# Patient Record
Sex: Male | Born: 2000 | Race: White | Hispanic: No | Marital: Single | Smoking: Never smoker
Health system: Southern US, Community
[De-identification: ages and names within clinical notes are randomized; demographics above are authoritative.]

## PROBLEM LIST (undated history)

## (undated) DIAGNOSIS — R519 Headache, unspecified: Secondary | ICD-10-CM

## (undated) DIAGNOSIS — R51 Headache: Secondary | ICD-10-CM

## (undated) HISTORY — DX: Headache: R51

## (undated) HISTORY — DX: Headache, unspecified: R51.9

---
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
## 2022-01-01 ENCOUNTER — Other Ambulatory Visit: Payer: Self-pay

## 2022-01-02 ENCOUNTER — Other Ambulatory Visit: Payer: Self-pay

## 2022-01-19 ENCOUNTER — Other Ambulatory Visit: Payer: Self-pay

## 2022-01-26 ENCOUNTER — Ambulatory Visit: Payer: BC Managed Care – PPO | Admitting: Family Medicine

## 2022-02-02 NOTE — Progress Notes (Deleted)
  [name] [address] [place] [address] Phone: [phone] Subjective:    I'm seeing this patient by the request  of:  Glori Luis, MD  CC:   [id number]  Matthew Parks is a 21 y.o. male coming in with complaint of back and neck pain. OMT 12/22/2021. Patient states   Medications patient has been prescribed: Zanaflex  Taking:         Reviewed prior external information including notes and imaging from previsou exam, outside providers and external EMR if available.   As well as notes that were available from care everywhere and other healthcare systems.  Past medical history, social, surgical and family history all reviewed in electronic medical record.  No pertanent information unless stated regarding to the chief complaint.   Past Medical History:  Diagnosis Date   Headache     Allergies  Allergen Reactions   Scopolamine Other (See Comments)    Extreme anxiety and confusion     Review of Systems:  No headache, visual changes, nausea, vomiting, diarrhea, constipation, dizziness, abdominal pain, skin rash, fevers, chills, night sweats, weight loss, swollen lymph nodes, body aches, joint swelling, chest pain, shortness of breath, mood changes. POSITIVE muscle aches  Objective  There were no vitals taken for this visit.   General: No apparent distress alert and oriented x3 mood and affect normal, dressed appropriately.  HEENT: Pupils equal, extraocular movements intact  Respiratory: Patient's speak in full sentences and does not appear short of breath  Cardiovascular: No lower extremity edema, non tender, no erythema  Gait MSK:  Back   Osteopathic findings  C2 flexed rotated and side bent right C6 flexed rotated and side bent left T3 extended rotated and side bent right inhaled rib T9 extended rotated and side bent left L2 flexed rotated and side bent right Sacrum right on right       Assessment and  Plan:  No problem-specific Assessment & Plan notes found for this encounter.    Nonallopathic problems  Decision today to treat with OMT was based on Physical Exam  After verbal consent patient was treated with HVLA, ME, FPR techniques in cervical, rib, thoracic, lumbar, and sacral  areas  Patient tolerated the procedure well with improvement in symptoms  Patient given exercises, stretches and lifestyle modifications  See medications in patient instructions if given  Patient will follow up in 4-8 weeks             Note: This dictation was prepared with Dragon dictation along with smaller phrase technology. Any transcriptional errors that result from this process are unintentional.

## 2022-02-06 ENCOUNTER — Ambulatory Visit: Payer: BC Managed Care – PPO | Admitting: Family Medicine

## 2022-02-08 ENCOUNTER — Ambulatory Visit: Payer: BC Managed Care – PPO | Admitting: Family Medicine

## 2022-02-16 ENCOUNTER — Other Ambulatory Visit: Payer: Self-pay

## 2022-02-19 ENCOUNTER — Other Ambulatory Visit: Payer: Self-pay

## 2022-02-19 ENCOUNTER — Other Ambulatory Visit: Payer: Self-pay | Admitting: Family Medicine

## 2022-02-19 ENCOUNTER — Encounter: Payer: Self-pay | Admitting: Family Medicine

## 2022-02-19 DIAGNOSIS — F411 Generalized anxiety disorder: Secondary | ICD-10-CM

## 2022-02-20 ENCOUNTER — Other Ambulatory Visit: Payer: Self-pay

## 2022-02-20 MED ORDER — FLUOXETINE HCL 20 MG PO TABS
20.0000 mg | ORAL_TABLET | Freq: Every day | ORAL | 3 refills | Status: DC
  Filled 2022-02-20: qty 90, 90d supply, fill #0
  Filled 2022-07-17: qty 90, 90d supply, fill #1
  Filled 2022-09-10 – 2022-10-19 (×2): qty 90, 90d supply, fill #2
  Filled 2023-01-04: qty 90, 90d supply, fill #3

## 2022-02-21 ENCOUNTER — Telehealth: Payer: BC Managed Care – PPO | Admitting: Physician Assistant

## 2022-02-21 ENCOUNTER — Other Ambulatory Visit: Payer: Self-pay

## 2022-02-21 DIAGNOSIS — A084 Viral intestinal infection, unspecified: Secondary | ICD-10-CM

## 2022-02-21 MED ORDER — ONDANSETRON 4 MG PO TBDP
4.0000 mg | ORAL_TABLET | Freq: Three times a day (TID) | ORAL | 0 refills | Status: DC | PRN
  Filled 2022-02-21: qty 14, 5d supply, fill #0
  Filled 2022-02-21: qty 18, 21d supply, fill #0
  Filled 2022-07-13: qty 18, 21d supply, fill #1

## 2022-02-21 NOTE — Patient Instructions (Signed)
Matthew Parks, thank you for joining [name], PA-C for today's virtual visit.  While this provider is not your primary care provider (PCP), if your PCP is located in our provider database this encounter information will be shared with them immediately following your visit.   [facility] account gives you access to today's visit and all your visits, tests, and labs performed at [facility] " click here if you don't have a [facility] account or go to [email or web address]  Consent: (Patient) Matthew Parks provided verbal consent for this virtual visit at the beginning of the encounter.  Current Medications:  Current Outpatient Medications:    FLUoxetine (PROZAC) 20 MG tablet, Take 1 tablet (20 mg total) by mouth daily., Disp: 90 tablet, Rfl: 3   hydrOXYzine (ATARAX) 10 MG tablet, Take 1 tablet (10 mg total) by mouth 3 (three) times daily as needed., Disp: 90 tablet, Rfl: 0   hydrOXYzine (ATARAX) 25 MG tablet, Take 1 tablet (25 mg total) by mouth at bedtime as needed for anxiety., Disp: 30 tablet, Rfl: 1   ibuprofen (ADVIL) 600 MG tablet, Take 600 mg by mouth as needed., Disp: , Rfl:    meclizine (ANTIVERT) 25 MG tablet, Take 25 mg by mouth 3 (three) times daily as needed for dizziness or nausea., Disp: , Rfl:    promethazine (PHENERGAN) 12.5 MG tablet, Take 1 tablet (12.5 mg total) by mouth every 8 (eight) hours as needed for nausea or vomiting., Disp: 20 tablet, Rfl: 0   Rimegepant Sulfate (NURTEC) 75 MG TBDP, Take 75 mg by mouth every other day., Disp: 16 tablet, Rfl: 6   tiZANidine (ZANAFLEX) 2 MG tablet, Take 1 tablet (2 mg total) by mouth at bedtime., Disp: 30 tablet, Rfl: 0   zolmitriptan (ZOMIG-ZMT) 5 MG disintegrating tablet, Dissolve 1 tablet (5 mg total) by mouth as needed for migraine. May repeat a dose in 2 hours if headache persists. Max dose 2 tablets in 24 hours, Disp: 10 tablet, Rfl: 6   Medications ordered in this encounter:  No  orders of the defined types were placed in this encounter.    *If you need refills on other medications prior to your next appointment, please contact your pharmacy*  Follow-Up: Call back or seek an in-person evaluation if the symptoms worsen or if the condition fails to improve as anticipated.  [name] [phone]  Other Instructions Please keep hydrated and rest. Consider starting a daily probiotic. I have sent in a script for zofran to use as directed, if needed, for residual nausea or vomiting. Keep a bland diet (see below). If symptoms are not continuing to improve/resolve or any new or worsening symptoms develop, I want you to be evaluated in person.  Bland Diet A bland diet may consist of soft foods or foods that are not high in fat or are not greasy, acidic, or spicy. Avoiding certain foods may cause less irritation to your mouth, throat, stomach, or gastrointestinal tract. Avoiding certain foods may make you feel better. Everyone's tolerances are different. A bland diet should be based on what you can tolerate and what may cause discomfort. What is my plan? Your health care provider or dietitian may recommend specific changes to your diet to treat your symptoms. These changes may include: Eating small meals frequently. Cooking food until it is soft enough to chew easily. Taking the time to chew your food thoroughly, so it is easy to swallow and digest. Avoiding foods  that cause you discomfort. These may include spicy food, fried food, greasy foods, hard-to-chew foods, or citrus fruits and juices. Drinking slowly. What are tips for following this plan? Reading food labels To reduce fiber intake, look for food labels that say "whole," such as whole wheat or whole grain. Shopping Avoid food items that may have nuts or seeds. Avoid vegetables that may make you gassy or have a tough texture, such as broccoli, cauliflower, or corn. Cooking Cook foods thoroughly  so they have a soft texture. Meal planning Make sure you include foods from all food groups to eat a balanced diet. Eat a variety of types of foods. Eat foods and drink beverages that do not cause you discomfort. These may include soups and broths with cooked meats, pasta, and vegetables. Lifestyle Sit up after meals, avoid tight clothing, and take time to eat and chew your food slowly. Ask your health care provider whether you should take dietary supplements. General information Mildly season your foods. Some seasonings, such as cayenne pepper, vinegar, or hot sauce, may cause irritation. The foods, beverages, or seasonings to avoid should be based on individual tolerance. What foods should I eat? Fruits Canned or cooked fruit such as peaches, pears, or applesauce. Bananas. Vegetables Well-cooked vegetables. Canned or cooked vegetables such as carrots, green beans, beets, or spinach. Mashed or boiled potatoes. Grains  Hot cereals, such as cream of wheat and processed oatmeal. Rice. Bread, crackers, pasta, or tortillas made from refined white flour. Meats and other proteins  Eggs. Creamy peanut butter or other nut butters. Lean, well-cooked tender meats, such as beef, pork, chicken, or fish. Dairy Low-fat dairy products such as milk, cottage cheese, or yogurt. Beverages  Water. Herbal tea. Apple juice. Fats and oils Mild salad dressings. Canola or olive oil. Sweets and desserts Low-fat pudding, custard, or ice cream. Fruit gelatin. The items listed above may not be a complete list of foods and beverages you can eat. Contact a dietitian for more information. What foods should I avoid? Fruits Citrus fruits, such as oranges and grapefruit. Fruits with a stringy texture. Fruits that have lots of seeds, such as kiwi or strawberries. Dried fruits. Vegetables Raw, uncooked vegetables. Salads. Grains Whole grain breads, muffins, and cereals. Meats and other proteins Tough, fibrous  meats. Highly seasoned meat such as corned beef, smoked meats, or fish. Processed high-fat meats such as brats, hot dogs, or sausage. Dairy Full-fat dairy foods such as ice cream and cheese. Beverages Caffeinated drinks. Alcohol. Seasonings and condiments Strongly flavored seasonings or condiments. Hot sauce. Salsa. Other foods Spicy foods. Fried or greasy foods. Sour foods, such as pickled or fermented foods like sauerkraut. Foods high in fiber. The items listed above may not be a complete list of foods and beverages you should avoid. Contact a dietitian for more information. Summary A bland diet should be based on individual tolerance. It may consist of foods that are soft textured and do not have a lot of fat, fiber, acid, or seasonings. A bland diet may be recommended because avoiding certain foods, beverages, or spices may make you feel better. This information is not intended to replace advice given to you by your health care provider. Make sure you discuss any questions you have with your health care provider. Document Revised: 12/12/2020 Document Reviewed: 12/12/2020 Elsevier Patient Education  [place].    If you have been instructed to have an in-person evaluation today at a local Urgent Care facility, please use the link below. It  will take you to a list of all of our available [facility], including address, phone number and hours of operation. Please do not delay care.  [facility]  If you or a family member do not have a primary care provider, use the link below to schedule a visit and establish care. When you choose a [facility] primary care physician or advanced practice provider, you gain a long-term partner in health. Find a Primary Care Provider  Learn more about [facility]'s in-office and virtual care options: [facility]

## 2022-02-21 NOTE — Progress Notes (Signed)
Virtual Visit Consent   Matthew Parks, you are scheduled for a virtual visit with a [facility] provider today. Just as with appointments in the office, your consent must be obtained to participate. Your consent will be active for this visit and any virtual visit you may have with one of our providers in the next 365 days. If you have a MyChart account, a copy of this consent can be sent to you electronically.  As this is a virtual visit, video technology does not allow for your provider to perform a traditional examination. This may limit your provider's ability to fully assess your condition. If your provider identifies any concerns that need to be evaluated in person or the need to arrange testing (such as labs, EKG, etc.), we will make arrangements to do so. Although advances in technology are sophisticated, we cannot ensure that it will always work on either your end or our end. If the connection with a video visit is poor, the visit may have to be switched to a telephone visit. With either a video or telephone visit, we are not always able to ensure that we have a secure connection.  By engaging in this virtual visit, you consent to the provision of healthcare and authorize for your insurance to be billed (if applicable) for the services provided during this visit. Depending on your insurance coverage, you may receive a charge related to this service.  I need to obtain your verbal consent now. Are you willing to proceed with your visit today? Matthew Parks has provided verbal consent on 02/21/2022 for a virtual visit (video or telephone). [name], [place]  Date: 02/21/2022 4:12 PM  Virtual Visit via Video Note   I, [name], connected with  Matthew Parks  ([id number], 02/15/2000) on 02/21/22 at  4:00 PM EST by a video-enabled telemedicine application and verified that I am speaking with the correct person using two identifiers.  Location: Patient: Virtual Visit Location  Patient: Home Provider: Virtual Visit Location Provider: Home Office   I discussed the limitations of evaluation and management by telemedicine and the availability of in person appointments. The patient expressed understanding and agreed to proceed.    History of Present Illness: Matthew Parks is a 22 y.o. who identifies as a male who was assigned male at birth, and is being seen today for possible stomach bug. Notes having some nausea and diarrhea overnight into this morning with an episode of non-bloody emesis. Denies fever, chills. Took some OTc Meclizine that helped with some of the nausea. Is hydrating but not eating much. Denies melena or hematochezia. Missed class today due to symptoms.Marland Kitchen  HPI: HPI  Problems:  Patient Active Problem List   Diagnosis Date Noted   Neck pain 11/10/2021   Somatic dysfunction of spine, cervical 11/10/2021   Chronic migraine without aura without status migrainosus, not intractable 11/22/2020   Weight loss 11/22/2020   Generalized anxiety disorder 11/10/2019   Nausea & vomiting 11/10/2019   Nonallopathic lesion of lumbosacral region 01/20/2019   Nonallopathic lesion of sacral region 01/20/2019   Chronic bilateral low back pain without sciatica 02/24/2018   Allergic rhinitis 02/24/2018    Allergies:  Allergies  Allergen Reactions   Scopolamine Other (See Comments)    Extreme anxiety and confusion   Medications:  Current Outpatient Medications:    ondansetron (ZOFRAN-ODT) 4 MG disintegrating tablet, Take 1 tablet (4 mg total) by mouth every 8 (eight) hours as needed for nausea or  vomiting., Disp: 20 tablet, Rfl: 0   FLUoxetine (PROZAC) 20 MG tablet, Take 1 tablet (20 mg total) by mouth daily., Disp: 90 tablet, Rfl: 3   hydrOXYzine (ATARAX) 10 MG tablet, Take 1 tablet (10 mg total) by mouth 3 (three) times daily as needed., Disp: 90 tablet, Rfl: 0   hydrOXYzine (ATARAX) 25 MG tablet, Take 1 tablet (25 mg total) by mouth at bedtime as needed for  anxiety., Disp: 30 tablet, Rfl: 1   ibuprofen (ADVIL) 600 MG tablet, Take 600 mg by mouth as needed., Disp: , Rfl:    meclizine (ANTIVERT) 25 MG tablet, Take 25 mg by mouth 3 (three) times daily as needed for dizziness or nausea., Disp: , Rfl:    promethazine (PHENERGAN) 12.5 MG tablet, Take 1 tablet (12.5 mg total) by mouth every 8 (eight) hours as needed for nausea or vomiting., Disp: 20 tablet, Rfl: 0   Rimegepant Sulfate (NURTEC) 75 MG TBDP, Take 75 mg by mouth every other day., Disp: 16 tablet, Rfl: 6   tiZANidine (ZANAFLEX) 2 MG tablet, Take 1 tablet (2 mg total) by mouth at bedtime., Disp: 30 tablet, Rfl: 0   zolmitriptan (ZOMIG-ZMT) 5 MG disintegrating tablet, Dissolve 1 tablet (5 mg total) by mouth as needed for migraine. May repeat a dose in 2 hours if headache persists. Max dose 2 tablets in 24 hours, Disp: 10 tablet, Rfl: 6  Observations/Objective: Patient is well-developed, well-nourished in no acute distress.  Resting comfortably at home.  Head is normocephalic, atraumatic.  No labored breathing. Speech is clear and coherent with logical content.  Patient is alert and oriented at baseline.   Assessment and Plan: 1. Viral gastroenteritis - ondansetron (ZOFRAN-ODT) 4 MG disintegrating tablet; Take 1 tablet (4 mg total) by mouth every 8 (eight) hours as needed for nausea or vomiting.  Dispense: 20 tablet; Refill: 0  Supportive measures and OTC meds reviewed. Start [employer]. Handout given. Zofran per orders. If not continuing to resolve or any new/worsening symptoms, will need an in-person evaluation.   Follow Up Instructions: I discussed the assessment and treatment plan with the patient. The patient was provided an opportunity to ask questions and all were answered. The patient agreed with the plan and demonstrated an understanding of the instructions.  A copy of instructions were sent to the patient via MyChart unless otherwise noted below.   The patient was advised to call  back or seek an in-person evaluation if the symptoms worsen or if the condition fails to improve as anticipated.  Time:  I spent 10 minutes with the patient via telehealth technology discussing the above problems/concerns.    [name], PA-C

## 2022-04-12 ENCOUNTER — Other Ambulatory Visit: Payer: Self-pay | Admitting: Psychiatry

## 2022-04-12 ENCOUNTER — Other Ambulatory Visit: Payer: Self-pay

## 2022-04-12 MED ORDER — NURTEC 75 MG PO TBDP
75.0000 mg | ORAL_TABLET | ORAL | 1 refills | Status: DC
  Filled 2022-04-12: qty 16, 32d supply, fill #0
  Filled 2022-07-13: qty 16, 32d supply, fill #1

## 2022-05-04 ENCOUNTER — Other Ambulatory Visit: Payer: Self-pay

## 2022-06-29 ENCOUNTER — Encounter: Payer: Self-pay | Admitting: Family Medicine

## 2022-06-29 ENCOUNTER — Other Ambulatory Visit: Payer: Self-pay

## 2022-06-29 ENCOUNTER — Ambulatory Visit: Payer: BC Managed Care – PPO | Admitting: Family Medicine

## 2022-06-29 VITALS — BP 116/78 | HR 79 | Temp 97.3°F | Ht 71.05 in | Wt 162.0 lb

## 2022-06-29 DIAGNOSIS — F411 Generalized anxiety disorder: Secondary | ICD-10-CM | POA: Diagnosis not present

## 2022-06-29 DIAGNOSIS — M542 Cervicalgia: Secondary | ICD-10-CM | POA: Diagnosis not present

## 2022-06-29 MED ORDER — TIZANIDINE HCL 2 MG PO TABS
2.0000 mg | ORAL_TABLET | Freq: Every evening | ORAL | 0 refills | Status: AC | PRN
  Filled 2022-06-29: qty 30, 30d supply, fill #0

## 2022-06-29 NOTE — Assessment & Plan Note (Signed)
Chronic intermittent issue.  Discussed continued use of topical anti-inflammatory.  I will refill Zanaflex 2 mg by mouth at bedtime as needed to help with neck pain.  Advised to monitor for drowsiness and to not drive if he is drowsy when he takes this.

## 2022-06-29 NOTE — Progress Notes (Signed)
Marikay Alar, MD Phone: [phone]  Matthew Parks is a 22 y.o. male who presents today for f/u.  Anxiety: Patient notes this is fine.  He stopped taking the Prozac as he felt it was not beneficial.  He also was not remembering to take it every day.  He is also not taking hydroxyzine.  He notes no depression or SI.  Chronic intermittent neck pain: This is a chronic intermittent issue.  He has seen sports medicine for this in the past.  Patient thinks it may be related to how he sleeps.  In the past he has taken Zanaflex for this though notes he takes this less than once a week.  He is requesting a refill on that to have on hand in case he needs it.  He has been using a topical anti-inflammatory with good benefit.  Social History   Tobacco Use  Smoking Status Never  Smokeless Tobacco Never    Current Outpatient Medications on File Prior to Visit  Medication Sig Dispense Refill   FLUoxetine (PROZAC) 20 MG tablet Take 1 tablet (20 mg total) by mouth daily. 90 tablet 3   hydrOXYzine (ATARAX) 10 MG tablet Take 1 tablet (10 mg total) by mouth 3 (three) times daily as needed. 90 tablet 0   hydrOXYzine (ATARAX) 25 MG tablet Take 1 tablet (25 mg total) by mouth at bedtime as needed for anxiety. 30 tablet 1   ibuprofen (ADVIL) 600 MG tablet Take 600 mg by mouth as needed.     meclizine (ANTIVERT) 25 MG tablet Take 25 mg by mouth 3 (three) times daily as needed for dizziness or nausea.     ondansetron (ZOFRAN-ODT) 4 MG disintegrating tablet Take 1 tablet (4 mg total) by mouth every 8 (eight) hours as needed for nausea or vomiting. 20 tablet 0   promethazine (PHENERGAN) 12.5 MG tablet Take 1 tablet (12.5 mg total) by mouth every 8 (eight) hours as needed for nausea or vomiting. 20 tablet 0   Rimegepant Sulfate (NURTEC) 75 MG TBDP Take 1 tablet (75 mg total) by mouth every other day. 16 tablet 1   zolmitriptan (ZOMIG-ZMT) 5 MG disintegrating tablet Dissolve 1 tablet (5 mg total) by mouth as  needed for migraine. May repeat a dose in 2 hours if headache persists. Max dose 2 tablets in 24 hours 10 tablet 6   [DISCONTINUED] SUMAtriptan (IMITREX) 25 MG tablet Take 1 tablet (25 mg total) by mouth every 2 (two) hours as needed for migraine. May repeat in 2 hours if headache persists or recurs. 10 tablet 0   No current facility-administered medications on file prior to visit.     ROS see history of present illness  Objective  Physical Exam Vitals:   06/29/22 1355  BP: 116/78  Pulse: 79  Temp: (!) 97.3 F (36.3 C)  SpO2: 98%    BP Readings from Last 3 Encounters:  06/29/22 116/78  12/27/21 112/76  12/22/21 98/66   Wt Readings from Last 3 Encounters:  06/29/22 162 lb (73.5 kg) (61 %, Z= 0.28)*  12/27/21 156 lb 3.2 oz (70.9 kg) (55 %, Z= 0.13)*  12/22/21 152 lb (68.9 kg) (49 %, Z= -0.04)*   * Growth percentiles are based on CDC (Boys, 2-20 Years) data.    Physical Exam Constitutional:      General: He is not in acute distress.    Appearance: He is not diaphoretic.  Cardiovascular:     Rate and Rhythm: Normal rate and regular rhythm.  Heart sounds: Normal heart sounds.  Pulmonary:     Effort: Pulmonary effort is normal.     Breath sounds: Normal breath sounds.  Skin:    General: Skin is warm and dry.  Neurological:     Mental Status: He is alert.      Assessment/Plan: Please see individual problem list.  Generalized anxiety disorder Assessment & Plan: Chronic issue.  Asymptomatic currently.  Patient will monitor off of medication.  If he has worsening symptoms he will let us know.   Neck pain Assessment & Plan: Chronic intermittent issue.  Discussed continued use of topical anti-inflammatory.  I will refill Zanaflex 2 mg by mouth at bedtime as needed to help with neck pain.  Advised to monitor for drowsiness and to not drive if he is drowsy when he takes this.  Orders: -     tiZANidine HCl; Take 1 tablet (2 mg total) by mouth at bedtime as needed  for muscle spasms.  Dispense: 30 tablet; Refill: 0    Return in about 1 year (around 06/29/2023) for physical.   Marikay Alar, MD [facility]

## 2022-06-29 NOTE — Assessment & Plan Note (Signed)
Chronic issue.  Asymptomatic currently.  Patient will monitor off of medication.  If he has worsening symptoms he will let us know.

## 2022-07-13 ENCOUNTER — Other Ambulatory Visit (HOSPITAL_COMMUNITY): Payer: Self-pay

## 2022-07-13 ENCOUNTER — Other Ambulatory Visit: Payer: Self-pay

## 2022-07-16 ENCOUNTER — Other Ambulatory Visit: Payer: Self-pay

## 2022-07-17 ENCOUNTER — Other Ambulatory Visit: Payer: Self-pay

## 2022-07-19 ENCOUNTER — Other Ambulatory Visit: Payer: Self-pay | Admitting: Family Medicine

## 2022-07-19 ENCOUNTER — Other Ambulatory Visit: Payer: Self-pay

## 2022-07-19 DIAGNOSIS — A084 Viral intestinal infection, unspecified: Secondary | ICD-10-CM

## 2022-07-20 ENCOUNTER — Other Ambulatory Visit: Payer: Self-pay

## 2022-07-20 MED FILL — Ondansetron Orally Disintegrating Tab 4 MG: ORAL | 21 days supply | Qty: 18 | Fill #0 | Status: AC

## 2022-07-20 NOTE — Telephone Encounter (Signed)
Refilled: 02/21/2022 Last OV: 06/29/2022 Next OV: 07/03/23

## 2022-09-10 ENCOUNTER — Other Ambulatory Visit: Payer: Self-pay

## 2022-10-27 ENCOUNTER — Other Ambulatory Visit: Payer: Self-pay | Admitting: Psychiatry

## 2022-10-29 ENCOUNTER — Other Ambulatory Visit: Payer: Self-pay

## 2022-10-29 NOTE — Telephone Encounter (Signed)
Last seen on 11/28/21 per note " Continue Nurtec 75 mg every other day " No follow up scheduled

## 2022-10-31 ENCOUNTER — Other Ambulatory Visit: Payer: Self-pay

## 2022-12-25 ENCOUNTER — Other Ambulatory Visit (HOSPITAL_COMMUNITY): Payer: Self-pay

## 2023-04-12 ENCOUNTER — Other Ambulatory Visit: Payer: Self-pay

## 2023-04-15 ENCOUNTER — Other Ambulatory Visit: Payer: Self-pay | Admitting: Internal Medicine

## 2023-04-15 ENCOUNTER — Other Ambulatory Visit: Payer: Self-pay

## 2023-04-15 DIAGNOSIS — F411 Generalized anxiety disorder: Secondary | ICD-10-CM

## 2023-04-16 ENCOUNTER — Other Ambulatory Visit: Payer: Self-pay

## 2023-04-17 ENCOUNTER — Other Ambulatory Visit: Payer: Self-pay

## 2023-04-17 MED FILL — Fluoxetine HCl Tab 20 MG: ORAL | 30 days supply | Qty: 30 | Fill #0 | Status: AC

## 2023-04-19 ENCOUNTER — Other Ambulatory Visit: Payer: Self-pay

## 2023-06-17 ENCOUNTER — Encounter: Admitting: Nurse Practitioner

## 2023-06-17 ENCOUNTER — Encounter: Payer: Self-pay | Admitting: Nurse Practitioner

## 2023-06-17 ENCOUNTER — Ambulatory Visit: Admitting: Nurse Practitioner

## 2023-06-17 VITALS — BP 100/62 | HR 61 | Temp 97.6°F | Ht 71.0 in | Wt 152.0 lb

## 2023-06-17 DIAGNOSIS — Z1329 Encounter for screening for other suspected endocrine disorder: Secondary | ICD-10-CM | POA: Diagnosis not present

## 2023-06-17 DIAGNOSIS — M542 Cervicalgia: Secondary | ICD-10-CM

## 2023-06-17 DIAGNOSIS — G43709 Chronic migraine without aura, not intractable, without status migrainosus: Secondary | ICD-10-CM

## 2023-06-17 DIAGNOSIS — Z0000 Encounter for general adult medical examination without abnormal findings: Secondary | ICD-10-CM | POA: Diagnosis not present

## 2023-06-17 DIAGNOSIS — F411 Generalized anxiety disorder: Secondary | ICD-10-CM

## 2023-06-17 DIAGNOSIS — Z13220 Encounter for screening for lipoid disorders: Secondary | ICD-10-CM

## 2023-06-17 LAB — CBC WITH DIFFERENTIAL/PLATELET
Basophils Absolute: 0 10*3/uL (ref 0.0–0.1)
Basophils Relative: 0.4 % (ref 0.0–3.0)
Eosinophils Absolute: 0.1 10*3/uL (ref 0.0–0.7)
Eosinophils Relative: 2.1 % (ref 0.0–5.0)
HCT: 43.9 % (ref 39.0–52.0)
Hemoglobin: 14.8 g/dL (ref 13.0–17.0)
Lymphocytes Relative: 51.1 % — ABNORMAL HIGH (ref 12.0–46.0)
Lymphs Abs: 2.3 10*3/uL (ref 0.7–4.0)
MCHC: 33.8 g/dL (ref 30.0–36.0)
MCV: 91.3 fl (ref 78.0–100.0)
Monocytes Absolute: 0.4 10*3/uL (ref 0.1–1.0)
Monocytes Relative: 8.7 % (ref 3.0–12.0)
Neutro Abs: 1.7 10*3/uL (ref 1.4–7.7)
Neutrophils Relative %: 37.7 % — ABNORMAL LOW (ref 43.0–77.0)
Platelets: 225 10*3/uL (ref 150.0–400.0)
RBC: 4.81 Mil/uL (ref 4.22–5.81)
RDW: 13.6 % (ref 11.5–14.6)
WBC: 4.5 10*3/uL (ref 4.5–10.5)

## 2023-06-17 LAB — COMPREHENSIVE METABOLIC PANEL WITH GFR
ALT: 12 U/L (ref 0–53)
AST: 16 U/L (ref 0–37)
Albumin: 4.6 g/dL (ref 3.5–5.2)
Alkaline Phosphatase: 64 U/L (ref 39–117)
BUN: 21 mg/dL (ref 6–23)
CO2: 29 meq/L (ref 19–32)
Calcium: 9.3 mg/dL (ref 8.4–10.5)
Chloride: 103 meq/L (ref 96–112)
Creatinine, Ser: 0.97 mg/dL (ref 0.40–1.50)
GFR: 112.3 mL/min (ref >60.00)
Glucose, Bld: 87 mg/dL (ref 70–99)
Potassium: 4.3 meq/L (ref 3.5–5.1)
Sodium: 139 meq/L (ref 135–145)
Total Bilirubin: 0.6 mg/dL (ref 0.2–1.2)
Total Protein: 6.7 g/dL (ref 6.0–8.3)

## 2023-06-17 LAB — LIPID PANEL
Cholesterol: 183 mg/dL (ref 0–200)
HDL: 71.8 mg/dL (ref >39.00)
LDL Cholesterol: 96 mg/dL (ref 0–99)
NonHDL: 110.78
Total CHOL/HDL Ratio: 3
Triglycerides: 73 mg/dL (ref 0.0–149.0)
VLDL: 14.6 mg/dL (ref 0.0–40.0)

## 2023-06-17 LAB — TSH: TSH: 1.2 u[IU]/mL (ref 0.35–5.50)

## 2023-06-17 MED ORDER — PROMETHAZINE HCL 12.5 MG PO TABS: 12.5000 mg | ORAL_TABLET | Freq: Three times a day (TID) | ORAL | 0 refills | Status: AC | PRN

## 2023-06-17 MED ORDER — HYDROXYZINE HCL 10 MG PO TABS: 10.0000 mg | ORAL_TABLET | Freq: Three times a day (TID) | ORAL | 0 refills | Status: DC | PRN

## 2023-06-17 MED ORDER — FLUOXETINE HCL 20 MG PO TABS: 20.0000 mg | ORAL_TABLET | Freq: Every day | ORAL | 3 refills | Status: DC

## 2023-06-17 NOTE — Progress Notes (Signed)
 Bluford Burkitt, NP-C Phone: [phone]  Matthew Parks is a 23 y.o. male who presents today for transfer of care and annual exam.   Discussed the use of AI scribe software for clinical note transcription with the patient, who gave verbal consent to proceed.  History of Present Illness   Matthew Parks is a 23 year old male with migraines and neck pain who presents for transfer of care and annual exam. He is accompanied by his mother.  He experiences migraines that he believes are related to neck tension, particularly when at school. He has a history of neck pain from lacrosse injuries, managed with heat, ice, and Advil . Muscle relaxers like tizanidine  are used as needed, though he avoids them due to drowsiness. No severe migraines this school year, but headaches occur two to three times a week, often linked to neck tension.  He has a history of frequent Advil  use during lacrosse years, leading to increased migraines when stopped. Previously used Nurtec and zolmitriptan  for migraines, now relies on Advil  and non-pharmacological methods like resting in a dark room. Nurtec is kept on hand for severe migraines, though not used in the past year.  Chronic headaches are described as being on the verge of a headache most days, especially at school. Managed by avoiding screen time and using ice packs. Attributed to neck tension, with irregular use of muscle relaxers due to misplacement at school.  He takes fluoxetine  for anxiety during the school year, discontinuing it in the summer. Hydroxyzine  is used infrequently for acute anxiety episodes. Considering tapering off fluoxetine  over the summer and reassessing in the fall.  He reports dizziness, particularly when standing quickly or during certain movements, attributed to sensitivity rather than a chronic condition. Occasional abdominal discomfort was evaluated with an abdominal x-ray showing no significant findings.  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 He occasionally uses THC. Family history includes colon cancer and precancerous polyps. He has received flu and COVID vaccines. Sleep is adequate, though he reports waking easily and difficulty falling asleep due to a racing mind. No chest pain, shortness of breath, abdominal pain, constipation, diarrhea, burning with urination, or swelling in legs.      Social History   Tobacco Use  Smoking Status Never  Smokeless Tobacco Never    Current Outpatient Medications on File Prior to Visit  Medication Sig Dispense Refill   ibuprofen  (ADVIL ) 600 MG tablet Take 600 mg by mouth as needed.     meclizine (ANTIVERT) 25 MG tablet Take 25 mg by mouth 3 (three) times daily as needed for dizziness or nausea.     Rimegepant Sulfate  (NURTEC) 75 MG TBDP Take 1 tablet (75 mg total) by mouth every other day. 16 tablet 1   tiZANidine  (ZANAFLEX ) 2 MG tablet Take 1 tablet (2 mg total) by mouth at bedtime as needed for muscle spasms. 30 tablet 0   zolmitriptan  (ZOMIG -ZMT) 5 MG disintegrating tablet Dissolve 1 tablet (5 mg total) by mouth as needed for migraine. May repeat a dose in 2 hours if headache persists. Max dose 2 tablets in 24 hours 10 tablet 6   [DISCONTINUED] SUMAtriptan  (IMITREX ) 25 MG tablet Take 1 tablet (25 mg total) by mouth every 2 (two) hours as needed for migraine. May repeat in 2 hours if headache persists or recurs. 10 tablet 0   No current facility-administered medications on file prior to visit.  ROS see history of present illness  Objective  Physical Exam Vitals:   06/17/23 0911  BP: 100/62  Pulse: 61  Temp: 97.6 F (36.4 C)  SpO2: 95%    BP Readings from Last 3 Encounters:  06/17/23 100/62  06/29/22 116/78  12/27/21 112/76   Wt Readings from Last 3 Encounters:  06/17/23 152 lb (68.9 kg)  06/29/22 162 lb (73.5 kg) (61%, Z= 0.28)*  12/27/21 156 lb 3.2 oz (70.9 kg) (55%, Z= 0.13)*   *  Growth percentiles are based on CDC (Boys, 2-20 Years) data.    Physical Exam Constitutional:      General: He is not in acute distress.    Appearance: Normal appearance.  HENT:     Head: Normocephalic.     Right Ear: Tympanic membrane normal.     Left Ear: Tympanic membrane normal.     Nose: Nose normal.     Mouth/Throat:     Mouth: Mucous membranes are moist.     Pharynx: Oropharynx is clear.  Eyes:     Conjunctiva/sclera: Conjunctivae normal.     Pupils: Pupils are equal, round, and reactive to light.  Neck:     Thyroid: No thyromegaly.  Cardiovascular:     Rate and Rhythm: Normal rate and regular rhythm.     Heart sounds: Normal heart sounds.  Pulmonary:     Effort: Pulmonary effort is normal.     Breath sounds: Normal breath sounds.  Abdominal:     General: Abdomen is flat. Bowel sounds are normal.     Palpations: Abdomen is soft. There is no mass.     Tenderness: There is no abdominal tenderness.  Musculoskeletal:        General: Normal range of motion.  Lymphadenopathy:     Cervical: No cervical adenopathy.  Skin:    General: Skin is warm and dry.     Findings: No rash.  Neurological:     General: No focal deficit present.     Mental Status: He is alert.  Psychiatric:        Mood and Affect: Mood normal.        Behavior: Behavior normal.      Assessment/Plan: Please see individual problem list.  Preventative health care Assessment & Plan: Physical exam complete. We will check routine lab work as outlined and contact patient with results. Family history of colon cancer was discussed. Flu and tetanus vaccines are up to date. He has received 2 COVID vaccines and declines additional. Continue routine dental and eye exams. Encourage continued healthy diet and regular exercise. Plan for a follow-up check-in during winter break, sooner as needed.   Orders: -     CBC with Differential/Platelet -     Comprehensive metabolic panel with GFR  Generalized anxiety  disorder Assessment & Plan: Anxiety is managed with fluoxetine  and hydroxyzine . Consistent fluoxetine  use is emphasized, especially during school terms, with plans to taper off during summer. Continue fluoxetine  20 mg daily. Refill hydroxyzine  10 mg, 1-2 tablets as needed for acute anxiety. Discuss the potential need for consistent fluoxetine  use year round if symptoms persist. Advised to contact if anxiety is worsening or changing. PHQ- 5 and GAD- 2 today.   Orders: -     FLUoxetine  HCl; Take 1 tablet (20 mg total) by mouth daily.  Dispense: 90 tablet; Refill: 3 -     hydrOXYzine  HCl; Take 1-2 tablets (10-20 mg total) by mouth 3 (three) times daily as needed.  Dispense: 90 tablet;  Refill: 0  Chronic migraine without aura without status migrainosus, not intractable Assessment & Plan: Chronic migraines with severe episodes are managed with ibuprofen . Nurtec is preferred for severe episodes due to its dissolvable form. Previously managed by Neurology. He will continue Nurtec for severe migraine episodes. Continue ibuprofen  as needed for mild migraines. Encouraged to contact if worsening or changing. Consider referral back to neurology.  Orders: -     Promethazine  HCl; Take 1 tablet (12.5 mg total) by mouth every 8 (eight) hours as needed for nausea or vomiting.  Dispense: 20 tablet; Refill: 0  Neck pain Assessment & Plan: Chronic neck pain, likely from past injuries, is managed with heat, ice, and ibuprofen . Tizanidine  causes drowsiness, so alternative muscle relaxers were discussed. He will continue tizanidine  for use at bedtime as needed. Consider alternative muscle relaxers like baclofen or methocarbamol if needed. Previously followed by Sports Med/Ortho consider referral back if symptoms worsen.   Lipid screening -     Lipid panel  Thyroid disorder screen -     TSH     Return in about 7 months (around 01/17/2024) for Follow up.   Bluford Burkitt, NP-C [facility]

## 2023-06-21 ENCOUNTER — Ambulatory Visit: Payer: Self-pay | Admitting: Family

## 2023-06-27 DIAGNOSIS — Z0000 Encounter for general adult medical examination without abnormal findings: Secondary | ICD-10-CM | POA: Insufficient documentation

## 2023-06-27 NOTE — Assessment & Plan Note (Addendum)
 Physical exam complete. We will check routine lab work as outlined and contact patient with results. Family history of colon cancer was discussed. Flu and tetanus vaccines are up to date. He has received 2 COVID vaccines and declines additional. Continue routine dental and eye exams. Encourage continued healthy diet and regular exercise. Plan for a follow-up check-in during winter break, sooner as needed.

## 2023-06-27 NOTE — Assessment & Plan Note (Addendum)
 Chronic neck pain, likely from past injuries, is managed with heat, ice, and ibuprofen . Tizanidine  causes drowsiness, so alternative muscle relaxers were discussed. He will continue tizanidine  for use at bedtime as needed. Consider alternative muscle relaxers like baclofen or methocarbamol if needed. Previously followed by Sports Med/Ortho consider referral back if symptoms worsen.

## 2023-06-27 NOTE — Assessment & Plan Note (Addendum)
 Chronic migraines with severe episodes are managed with ibuprofen . Nurtec is preferred for severe episodes due to its dissolvable form. Previously managed by Neurology. He will continue Nurtec for severe migraine episodes. Continue ibuprofen  as needed for mild migraines. Encouraged to contact if worsening or changing. Consider referral back to neurology.

## 2023-06-27 NOTE — Assessment & Plan Note (Signed)
 Anxiety is managed with fluoxetine  and hydroxyzine . Consistent fluoxetine  use is emphasized, especially during school terms, with plans to taper off during summer. Continue fluoxetine  20 mg daily. Refill hydroxyzine  10 mg, 1-2 tablets as needed for acute anxiety. Discuss the potential need for consistent fluoxetine  use year round if symptoms persist. Advised to contact if anxiety is worsening or changing. PHQ- 5 and GAD- 2 today.

## 2023-07-03 ENCOUNTER — Encounter: Payer: BC Managed Care – PPO | Admitting: Family Medicine

## 2023-07-03 ENCOUNTER — Encounter: Payer: BC Managed Care – PPO | Admitting: Nurse Practitioner

## 2023-09-25 ENCOUNTER — Ambulatory Visit: Payer: Self-pay

## 2023-09-25 NOTE — Telephone Encounter (Signed)
 Patient scheduled with provider for tomorrow.

## 2023-09-25 NOTE — Telephone Encounter (Signed)
 FYI Only or Action Required?: FYI only for provider.  Patient was last seen in primary care on 06/17/2023 by Gretel App, NP.  Called Nurse Triage reporting No chief complaint on file..  Symptoms began several days ago.  Interventions attempted: Nothing.  Symptoms are: gradually worsening.  Triage Disposition: See PCP When Office is Open (Within 3 Days)  Patient/caregiver understands and will follow disposition?: Yes    Copied from CRM [id number]. Topic: Clinical - Red Word Triage >> Sep 25, 2023 12:11 PM Deleta RAMAN wrote: Red Word that prompted transfer to Nurse Triage: patient mom is calling due to panic attacks since starting college on Sunday suffers from anxiety Reason for Disposition  MODERATE anxiety (e.g., persistent or frequent anxiety symptoms; interferes with sleep, school, or work)  Answer Assessment - Initial Assessment Questions 1. CONCERN: Did anything happen that prompted you to call today?      Unable to function due to panic attacks  2. ANXIETY SYMPTOMS: Can you describe how you (your loved one; patient) have been feeling? (e.g., tense, restless, panicky, anxious, keyed up, overwhelmed, sense of impending doom).      Panic Attacks  3. ONSET: How long have you been feeling this way? (e.g., hours, days, weeks)     Since Sunday Night  4. SEVERITY: How would you rate the level of anxiety? (e.g., 0 - 10; or mild, moderate, severe).     Moderate to Severe  5. FUNCTIONAL IMPAIRMENT: How have these feelings affected your ability to do daily activities? Have you had more difficulty than usual doing your normal daily activities? (e.g., getting better, same, worse; self-care, school, work, interactions)     Unable to attend classes  6. HISTORY: Have you felt this way before? Have you ever been diagnosed with an anxiety problem in the past? (e.g., generalized anxiety disorder, panic attacks, PTSD). If Yes, ask: How was this problem treated? (e.g.,  medicines, counseling, etc.)     Yes   7. RISK OF HARM - SUICIDAL IDEATION: Do you ever have thoughts of hurting or killing yourself? If Yes, ask:  Do you have these feelings now? Do you have a plan on how you would do this?     No  8. TREATMENT:  What has been done so far to treat this anxiety? (e.g., medicines, relaxation strategies). What has helped?     Duloxentine (Cymbalta), Hydroxyzine , on and off  9. THERAPIST: Do you have a counselor or therapist? If Yes, ask: What is their name?     No  10. POTENTIAL TRIGGERS: Do you drink caffeinated beverages (e.g., coffee, colas, teas), and how much daily? Do you drink alcohol or use any drugs? Have you started any new medicines recently?       No caffeine mostly, water and milk is main source of hydration  11. PATIENT SUPPORT: Who is with you now? Who do you live with? Do you have family or friends who you can talk to?        Lives in [place] for school  12. OTHER SYMPTOMS: Do you have any other symptoms? (e.g., feeling depressed, trouble concentrating, trouble sleeping, trouble breathing, palpitations or fast heartbeat, chest pain, sweating, nausea, or diarrhea)       Diarrhea, Nausea, Loss of Appetite  Protocols used: Anxiety and Panic Attack-A-AH

## 2023-09-26 ENCOUNTER — Encounter: Payer: Self-pay | Admitting: Nurse Practitioner

## 2023-09-26 ENCOUNTER — Ambulatory Visit: Admitting: Nurse Practitioner

## 2023-09-26 VITALS — BP 116/66 | HR 60 | Temp 98.0°F | Resp 20 | Ht 71.0 in | Wt 142.1 lb

## 2023-09-26 DIAGNOSIS — F411 Generalized anxiety disorder: Secondary | ICD-10-CM

## 2023-09-26 DIAGNOSIS — F4323 Adjustment disorder with mixed anxiety and depressed mood: Secondary | ICD-10-CM

## 2023-09-26 DIAGNOSIS — F1290 Cannabis use, unspecified, uncomplicated: Secondary | ICD-10-CM

## 2023-09-26 MED ORDER — SERTRALINE HCL 50 MG PO TABS: 50.0000 mg | ORAL_TABLET | Freq: Every day | ORAL | 0 refills | Status: DC

## 2023-09-26 MED ORDER — HYDROXYZINE HCL 10 MG PO TABS: 10.0000 mg | ORAL_TABLET | Freq: Three times a day (TID) | ORAL | 2 refills | Status: AC | PRN

## 2023-09-26 NOTE — Progress Notes (Signed)
 Leron Glance, NP-C Phone: [phone]  Matthew Parks is a 23 y.o. male who presents today for anxiety.   Discussed the use of AI scribe software for clinical note transcription with the patient, who gave verbal consent to proceed.  History of Present Illness   Matthew Parks is a 22 year old male with anxiety and panic attacks who presents with increased anxiety and cannabis use.  He has been experiencing increased anxiety and panic attacks, similar to previous episodes when he first started experiencing these symptoms. His anxiety worsens with the start of the school year, leading to difficulty eating and attending classes over the past two weeks. Life changes and stressors, such as returning to school and concerns about family and personal issues, exacerbate his anxiety.  He describes a cycle of cannabis use where he initially stops but eventually resumes, using it more frequently over time. Cannabis does not alleviate his anxiety but becomes a habitual coping mechanism. He experiences emotional hangovers after use, which are existential and emotionally taxing. No use of other drugs besides cannabis. No thoughts of self-harm.  He has a history of using fluoxetine  for anxiety and depression, which he stopped at the start of summer. He resumed taking it last week after experiencing significant anxiety and difficulty functioning. He has been taking hydroxyzine  10 mg as needed for anxiety, sometimes increasing the dose to 20 mg or 30 mg when necessary. He has also been using Zofran  to manage nausea associated with anxiety.  He has a family history of mental health issues, including a brother who experienced THC-induced psychosis and bipolar disorder. He has previously engaged in physical activities like going to the gym, which he finds beneficial for managing his anxiety, but he has not been able to maintain this routine recently.      Social History   Tobacco Use  Smoking Status Never   Smokeless Tobacco Never    Current Outpatient Medications on File Prior to Visit  Medication Sig Dispense Refill   ibuprofen  (ADVIL ) 600 MG tablet Take 600 mg by mouth as needed.     meclizine (ANTIVERT) 25 MG tablet Take 25 mg by mouth 3 (three) times daily as needed for dizziness or nausea.     promethazine  (PHENERGAN ) 12.5 MG tablet Take 1 tablet (12.5 mg total) by mouth every 8 (eight) hours as needed for nausea or vomiting. 20 tablet 0   tiZANidine  (ZANAFLEX ) 2 MG tablet Take 1 tablet (2 mg total) by mouth at bedtime as needed for muscle spasms. 30 tablet 0   zolmitriptan  (ZOMIG -ZMT) 5 MG disintegrating tablet Dissolve 1 tablet (5 mg total) by mouth as needed for migraine. May repeat a dose in 2 hours if headache persists. Max dose 2 tablets in 24 hours 10 tablet 6   Rimegepant Sulfate  (NURTEC) 75 MG TBDP Take 1 tablet (75 mg total) by mouth every other day. (Patient not taking: Reported on 09/26/2023) 16 tablet 1   [DISCONTINUED] SUMAtriptan  (IMITREX ) 25 MG tablet Take 1 tablet (25 mg total) by mouth every 2 (two) hours as needed for migraine. May repeat in 2 hours if headache persists or recurs. 10 tablet 0   No current facility-administered medications on file prior to visit.     ROS see history of present illness  Objective  Physical Exam Vitals:   09/26/23 1405  BP: 116/66  Pulse: 60  Resp: 20  Temp: 98 F (36.7 C)  SpO2: 98%    BP Readings from Last 3 Encounters:  09/26/23 116/66  06/17/23 100/62  06/29/22 116/78   Wt Readings from Last 3 Encounters:  09/26/23 142 lb 2 oz (64.5 kg)  06/17/23 152 lb (68.9 kg)  06/29/22 162 lb (73.5 kg) (61%, Z= 0.28)*   * Growth percentiles are based on CDC (Boys, 2-20 Years) data.    Physical Exam Constitutional:      General: He is not in acute distress.    Appearance: Normal appearance.  HENT:     Head: Normocephalic.  Cardiovascular:     Rate and Rhythm: Normal rate and regular rhythm.     Heart sounds: Normal heart  sounds.  Pulmonary:     Effort: Pulmonary effort is normal.     Breath sounds: Normal breath sounds.  Skin:    General: Skin is warm and dry.  Neurological:     General: No focal deficit present.     Mental Status: He is alert.  Psychiatric:        Mood and Affect: Mood is anxious.        Behavior: Behavior normal.     Assessment/Plan: Please see individual problem list.  Adjustment disorder with mixed anxiety and depressed mood Assessment & Plan: He experiences increased anxiety and panic attacks, with difficulty in daily activities. PHQ- 21 and GAD- 19 today. Fluoxetine  was restarted but concerns about exacerbation led to switching to sertraline . Switch to sertraline  50 mg at bedtime. Continue hydroxyzine  10 mg, 1-2 tablets as needed. Encourage medication adherence. Discuss potential sertraline  side effects. Consider Buspar if anxiety persists. Encourage therapy. Encouraged to contact if worsening symptoms, unusual behavior changes or suicidal thoughts occur.   Orders: -     Sertraline  HCl; Take 1 tablet (50 mg total) by mouth at bedtime.  Dispense: 90 tablet; Refill: 0 -     hydrOXYzine  HCl; Take 1-2 tablets (10-20 mg total) by mouth 3 (three) times daily as needed.  Dispense: 120 tablet; Refill: 2  Cannabis use disorder Assessment & Plan: He struggles to control cannabis use, resulting in nausea and vomiting. He acknowledges the need to quit and has attempted multiple times. Discuss cannabis-induced vomiting syndrome and its impact on mental health. Encourage cessation of cannabis use. Support cessation efforts and address mental health issues.       Return in about 6 weeks (around 11/07/2023) for Anxiety/Depression.   Leron Glance, NP-C [facility]

## 2023-10-09 ENCOUNTER — Encounter: Payer: Self-pay | Admitting: Nurse Practitioner

## 2023-10-09 DIAGNOSIS — F1290 Cannabis use, unspecified, uncomplicated: Secondary | ICD-10-CM | POA: Insufficient documentation

## 2023-10-09 NOTE — Assessment & Plan Note (Signed)
 He struggles to control cannabis use, resulting in nausea and vomiting. He acknowledges the need to quit and has attempted multiple times. Discuss cannabis-induced vomiting syndrome and its impact on mental health. Encourage cessation of cannabis use. Support cessation efforts and address mental health issues.

## 2023-10-09 NOTE — Assessment & Plan Note (Signed)
 He experiences increased anxiety and panic attacks, with difficulty in daily activities. PHQ- 21 and GAD- 19 today. Fluoxetine  was restarted but concerns about exacerbation led to switching to sertraline . Switch to sertraline  50 mg at bedtime. Continue hydroxyzine  10 mg, 1-2 tablets as needed. Encourage medication adherence. Discuss potential sertraline  side effects. Consider Buspar if anxiety persists. Encourage therapy. Encouraged to contact if worsening symptoms, unusual behavior changes or suicidal thoughts occur.

## 2023-11-08 ENCOUNTER — Ambulatory Visit: Admitting: Nurse Practitioner

## 2023-11-08 ENCOUNTER — Encounter: Payer: Self-pay | Admitting: Nurse Practitioner

## 2023-11-08 VITALS — BP 124/76 | HR 63 | Temp 98.0°F | Ht 71.0 in | Wt 148.4 lb

## 2023-11-08 DIAGNOSIS — Z23 Encounter for immunization: Secondary | ICD-10-CM | POA: Diagnosis not present

## 2023-11-08 DIAGNOSIS — M542 Cervicalgia: Secondary | ICD-10-CM | POA: Diagnosis not present

## 2023-11-08 DIAGNOSIS — F4323 Adjustment disorder with mixed anxiety and depressed mood: Secondary | ICD-10-CM | POA: Diagnosis not present

## 2023-11-08 MED ORDER — SERTRALINE HCL 50 MG PO TABS: 50.0000 mg | ORAL_TABLET | Freq: Every day | ORAL | 2 refills | Status: DC

## 2023-11-08 NOTE — Assessment & Plan Note (Signed)
 Significant improvement in symptoms. He has been taking Zoloft  50 mg daily at bedtime. PHQ improved today from 21 to 5. GAD improved from 19 to 2 today. Appetite has returned. He feels symptoms are well controlled at this time on current dose and does not need to increase. Continue Zoloft  50 mg daily. Encouraged to contact if worsening symptoms, unusual behavior changes or suicidal thoughts occur.

## 2023-11-08 NOTE — Assessment & Plan Note (Signed)
 Chronic neck pain, likely from past injuries and stress is managed with heat, ice, and Tylenol. He will continue tizanidine  at bedtime as needed. Discussed massage therapy, he has a massage scheduled for tomorrow. Consider PT if symptoms worsen. Continue current regimen.

## 2023-11-08 NOTE — Progress Notes (Signed)
 Matthew Glance, NP-C Phone: [phone]  Matthew Parks is a 23 y.o. male who presents today for follow up on anxiety and depression.   Discussed the use of AI scribe software for clinical note transcription with the patient, who gave verbal consent to proceed.  History of Present Illness   Matthew Parks is a 23 year old male who presents for follow-up of anxiety and mood management.  He has been feeling significantly better since his last visit, following a switch from Prozac  to Zoloft  approximately six weeks ago. He takes Zoloft  nightly, about an hour before bed, usually around 9 PM.  He experiences daytime sleepiness and is unsure if it is due to the medication or his sleep schedule. He has been waking up earlier, around 8 AM, and is adjusting to this new routine. Despite feeling sleepy after being awake for about three hours, he feels he is getting back into his routine.  He notes a difference in his mood and anxiety levels since starting Zoloft . His appetite has returned to normal. He has gained 6 pounds since his last visit. He has continued to abstain from cannabis since last visit. He feels more like himself and less anxious about things. He is able to not worry as much about situations.   Social History   Tobacco Use  Smoking Status Never  Smokeless Tobacco Never    Current Outpatient Medications on File Prior to Visit  Medication Sig Dispense Refill   hydrOXYzine  (ATARAX ) 10 MG tablet Take 1-2 tablets (10-20 mg total) by mouth 3 (three) times daily as needed. 120 tablet 2   ibuprofen  (ADVIL ) 600 MG tablet Take 600 mg by mouth as needed.     meclizine (ANTIVERT) 25 MG tablet Take 25 mg by mouth 3 (three) times daily as needed for dizziness or nausea.     promethazine  (PHENERGAN ) 12.5 MG tablet Take 1 tablet (12.5 mg total) by mouth every 8 (eight) hours as needed for nausea or vomiting. 20 tablet 0   tiZANidine  (ZANAFLEX ) 2 MG tablet Take 1 tablet (2 mg total) by mouth at  bedtime as needed for muscle spasms. 30 tablet 0   zolmitriptan  (ZOMIG -ZMT) 5 MG disintegrating tablet Dissolve 1 tablet (5 mg total) by mouth as needed for migraine. May repeat a dose in 2 hours if headache persists. Max dose 2 tablets in 24 hours 10 tablet 6   [DISCONTINUED] SUMAtriptan  (IMITREX ) 25 MG tablet Take 1 tablet (25 mg total) by mouth every 2 (two) hours as needed for migraine. May repeat in 2 hours if headache persists or recurs. 10 tablet 0   No current facility-administered medications on file prior to visit.     ROS see history of present illness  Objective  Physical Exam Vitals:   11/08/23 1521  BP: 124/76  Pulse: 63  Temp: 98 F (36.7 C)  SpO2: 97%    BP Readings from Last 3 Encounters:  11/08/23 124/76  09/26/23 116/66  06/17/23 100/62   Wt Readings from Last 3 Encounters:  11/08/23 148 lb 6.4 oz (67.3 kg)  09/26/23 142 lb 2 oz (64.5 kg)  06/17/23 152 lb (68.9 kg)    Physical Exam Constitutional:      General: He is not in acute distress.    Appearance: Normal appearance.  HENT:     Head: Normocephalic.  Cardiovascular:     Rate and Rhythm: Normal rate and regular rhythm.     Heart sounds: Normal heart sounds.  Pulmonary:  Effort: Pulmonary effort is normal.     Breath sounds: Normal breath sounds.  Skin:    General: Skin is warm and dry.  Neurological:     General: No focal deficit present.     Mental Status: He is alert.  Psychiatric:        Mood and Affect: Mood normal.        Behavior: Behavior normal.      Assessment/Plan: Please see individual problem list.  Adjustment disorder with mixed anxiety and depressed mood Assessment & Plan: Significant improvement in symptoms. He has been taking Zoloft  50 mg daily at bedtime. PHQ improved today from 21 to 5. GAD improved from 19 to 2 today. Appetite has returned. He feels symptoms are well controlled at this time on current dose and does not need to increase. Continue Zoloft  50 mg  daily. Encouraged to contact if worsening symptoms, unusual behavior changes or suicidal thoughts occur.   Orders: -     Sertraline  HCl; Take 1 tablet (50 mg total) by mouth at bedtime.  Dispense: 90 tablet; Refill: 2  Neck pain Assessment & Plan: Chronic neck pain, likely from past injuries and stress is managed with heat, ice, and Tylenol. He will continue tizanidine  at bedtime as needed. Discussed massage therapy, he has a massage scheduled for tomorrow. Consider PT if symptoms worsen. Continue current regimen.    Need for influenza vaccination -     Flu vaccine trivalent PF, 6mos and older(Flulaval,Afluria,Fluarix,Fluzone)     Return for as needed.   Matthew Glance, NP-C [facility]

## 2023-12-05 ENCOUNTER — Ambulatory Visit: Payer: Self-pay

## 2023-12-05 NOTE — Telephone Encounter (Signed)
 FYI Only or Action Required?: FYI only for provider: appointment scheduled on 12/09/2023.  Patient was last seen in primary care on 11/08/2023 by Gretel App, NP.  Called Nurse Triage reporting Leg Pain.  Symptoms began a week ago.  Interventions attempted: Nothing.  Symptoms are: unchanged.  Triage Disposition: See PCP Within 2 Weeks  Patient/caregiver understands and will follow disposition?: Yes     Copied from CRM [id number]. Topic: Clinical - Red Word Triage >> Dec 05, 2023 12:22 PM Suzen RAMAN wrote: Red Word that prompted transfer to Nurse Triage: wound on leg thats not healing well denies other symptoms   mother on the line requesting an appt on Monday for patient who is currently away at college but will be home on Monday Reason for Disposition  [1] MILD pain (e.g., does not interfere with normal activities) AND [2] present > 7 days  Answer Assessment - Initial Assessment Questions 1. ONSET: When did the pain start?      Over a week  2. LOCATION: Where is the pain located?      Leg unsure if R or L  3. PAIN: How bad is the pain?    (Scale 1-10; or mild, moderate, severe)     Mild  4. CAUSE: What do you think is causing the leg pain?     Hit leg at college  5. OTHER SYMPTOMS: Do you have any other symptoms? (e.g., chest pain, back pain, breathing difficulty, swelling, rash, fever, numbness, weakness)     Swollen lymph nodes underneath his arm due to a cat scratch.     This RN spoke with the patient's mother regarding his smptoms. 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 Area now red and inflamed and patient's mother states symptoms are not worsening or getting better.  Protocols used: Leg Pain-A-AH

## 2023-12-09 ENCOUNTER — Ambulatory Visit: Admitting: Internal Medicine

## 2023-12-09 ENCOUNTER — Encounter: Payer: Self-pay | Admitting: Internal Medicine

## 2023-12-09 VITALS — BP 122/80 | HR 59 | Temp 97.6°F | Ht 71.0 in | Wt 146.2 lb

## 2023-12-09 DIAGNOSIS — L03116 Cellulitis of left lower limb: Secondary | ICD-10-CM

## 2023-12-09 DIAGNOSIS — W5503XD Scratched by cat, subsequent encounter: Secondary | ICD-10-CM

## 2023-12-09 DIAGNOSIS — M25571 Pain in right ankle and joints of right foot: Secondary | ICD-10-CM

## 2023-12-09 DIAGNOSIS — S60512D Abrasion of left hand, subsequent encounter: Secondary | ICD-10-CM

## 2023-12-09 MED ORDER — CEPHALEXIN 500 MG PO CAPS: 500.0000 mg | ORAL_CAPSULE | Freq: Two times a day (BID) | ORAL | 0 refills | Status: AC

## 2023-12-09 NOTE — Assessment & Plan Note (Signed)
-   Patient complains of pain above his right medial malleolus for the last week -States that he is able to bear weight but has noted some difficulty ambulating longer distances -Does have some mild tenderness to palpation about the medial malleolus -Patient states that the pain is improving -I suspect he has a mild right ankle sprain which is slowly improving -Would like to defer x-ray at this time -No further workup at this time

## 2023-12-09 NOTE — Progress Notes (Signed)
 Acute Office Visit  Subjective:     Patient ID: Matthew Parks, male    DOB: [birth year], 23 y.o.   MRN: [id number]  Chief Complaint  Patient presents with   Acute Visit    Lower left leg wound, Right ankle pain 5/10 x 1 week, left armpit swollen lymph node and left palm cat scratch    HPI Patient is in today for follow-up for a recent cat scratch and left axillary lymphadenopathy as well as left lower extremity pain following trauma to the area.  Patient states that in September he had a cat scratch of his left palm.  Approximately 1 month later he noted left axilla lymphadenopathy and went to urgent care on October 18.  He was prescribed a course of azithromycin  which he completed.  He states that the lymph node swelling is improving but still persistent.  Denies any fevers or redness over the arm.  No purulent discharge at the lymph node site.  Patient does complain of pain and redness over his left shin.  Patient states that he had trauma to the area a few weeks ago but over the last week has noted pain and redness at the site of trauma.  No fevers or chills.  No purulent discharge.  Patient also complains of right ankle pain above the medial malleolus.  If some mild tenderness to palpation and states that the pain worsens when he walks a lot but he is able to bear weight and states that this pain is slowly improving.  Review of Systems  Constitutional: Negative.  Negative for chills and fever.  HENT: Negative.    Respiratory: Negative.    Gastrointestinal: Negative.   Musculoskeletal:        Right ankle pain  Skin:        Redness and pain over his left shin  Neurological: Negative.   Endo/Heme/Allergies:        Left axillary lymphadenopathy  Psychiatric/Behavioral: Negative.          Objective:    BP 122/80   Pulse (!) 59   Temp 97.6 F (36.4 C)   Ht 5' 11 (1.803 m)   Wt 146 lb 3.2 oz (66.3 kg)   SpO2 98%   BMI 20.39 kg/m    Physical Exam Constitutional:       Appearance: Normal appearance.  HENT:     Head: Normocephalic and atraumatic.  Cardiovascular:     Rate and Rhythm: Normal rate.     Heart sounds: Normal heart sounds.  Pulmonary:     Breath sounds: Normal breath sounds. No wheezing or rales.  Musculoskeletal:        General: Tenderness present.     Right lower leg: No edema.     Left lower leg: No edema.     Comments: Mild tenderness and erythema over left shin concerning for possible cellulitis  Lymphadenopathy:     Upper Body:     Left upper body: Axillary adenopathy present.     Comments: Left axillary lymphadenopathy with mild tenderness to palpation  Skin:    Comments: Mild erythema and tenderness to palpation over the left shin concerning for possible cellulitis  Neurological:     Mental Status: He is alert.  Psychiatric:        Mood and Affect: Mood normal.        Behavior: Behavior normal.     No results found for any visits on 12/09/23.      Assessment &  Plan:   Problem List Items Addressed This Visit       Musculoskeletal and Integument   Cat scratch of hand, left, subsequent encounter - Primary   - Patient has history of cat scratch of left palm in September with subsequent left axillary lymphadenopathy -He was seen in October at an urgent care center and prescribed azithromycin  for cat scratch disease -Patient states that he still has some persistent left axillary lymphadenopathy but this is slowly improving. -On exam, patient does have mildly tender left axillary lymphadenopathy -Patient likely had cat cratch disease and received an appropriate course of antibiotics (azithromycin ) for this -Lymphadenopathy is slowly improving but can persist for a few months even with appropriate antibiotic treatment.  No purulence noted.  No indication for further antibiotics at this time.        Other   Acute right ankle pain   - Patient complains of pain above his right medial malleolus for the last week -States  that he is able to bear weight but has noted some difficulty ambulating longer distances -Does have some mild tenderness to palpation about the medial malleolus -Patient states that the pain is improving -I suspect he has a mild right ankle sprain which is slowly improving -Would like to defer x-ray at this time -No further workup at this time      Cellulitis of left lower extremity   - Patient complained of trauma to his left shin a few weeks ago and over the last week has noted redness and tenderness over the site -On exam, patient does have mild tenderness to palpation with increased local warmth and erythema over his left shin concerning for possible cellulitis -Will treat the patient with Keflex (initially offered doxycycline but patient was concerned about GI side effects with this medication and states that he previously tolerated Keflex well).  Given that the patient has no purulence this is unlikely MRSA infection and should respond to Keflex.  However, if this does not improve with Keflex we will call in doxycycline for him -No further workup at this time      Relevant Medications   cephALEXin (KEFLEX) 500 MG capsule    Meds ordered this encounter  Medications   cephALEXin (KEFLEX) 500 MG capsule    Sig: Take 1 capsule (500 mg total) by mouth 2 (two) times daily.    Dispense:  14 capsule    Refill:  0    No follow-ups on file.  Sharmel Ballantine, MD

## 2023-12-09 NOTE — Assessment & Plan Note (Signed)
-   Patient has history of cat scratch of left palm in September with subsequent left axillary lymphadenopathy -He was seen in October at an urgent care center and prescribed azithromycin  for cat scratch disease -Patient states that he still has some persistent left axillary lymphadenopathy but this is slowly improving. -On exam, patient does have mildly tender left axillary lymphadenopathy -Patient likely had cat cratch disease and received an appropriate course of antibiotics (azithromycin ) for this -Lymphadenopathy is slowly improving but can persist for a few months even with appropriate antibiotic treatment.  No purulence noted.  No indication for further antibiotics at this time.

## 2023-12-09 NOTE — Patient Instructions (Signed)
-   It was a pleasure meeting you today -I suspect that you do have an infection (cellulitis) of your left leg which is causing the redness and pain at that site.  I have prescribed an antibiotic called Keflex to be taken twice a day for 7 days.  If you notice worsening pain or redness or any purulence at the site please follow-up with us  for further evaluation -Your right ankle pain appears to be a mild sprain that is currently improving.  If the pain worsens please let us  know and we can reevaluate this -You do have persistent lymph node enlargement in your left axillary region likely secondary to cat scratch disease.  This will take a few months to resolve and appears to be improving slowly.  You are already treated with appropriate antibiotics with the azithromycin  -Please contact us  with any questions or concerns or worsening symptoms

## 2023-12-09 NOTE — Assessment & Plan Note (Signed)
-   Patient complained of trauma to his left shin a few weeks ago and over the last week has noted redness and tenderness over the site -On exam, patient does have mild tenderness to palpation with increased local warmth and erythema over his left shin concerning for possible cellulitis -Will treat the patient with Keflex (initially offered doxycycline but patient was concerned about GI side effects with this medication and states that he previously tolerated Keflex well).  Given that the patient has no purulence this is unlikely MRSA infection and should respond to Keflex.  However, if this does not improve with Keflex we will call in doxycycline for him -No further workup at this time

## 2023-12-21 ENCOUNTER — Other Ambulatory Visit: Payer: Self-pay | Admitting: Nurse Practitioner

## 2023-12-21 DIAGNOSIS — F4323 Adjustment disorder with mixed anxiety and depressed mood: Secondary | ICD-10-CM

## 2024-01-24 ENCOUNTER — Ambulatory Visit: Admitting: Nurse Practitioner

## 2024-03-06 ENCOUNTER — Ambulatory Visit: Admitting: Nurse Practitioner

## 2024-04-23 ENCOUNTER — Ambulatory Visit: Admitting: Nurse Practitioner
